# Patient Record
Sex: Male | Born: 1960 | Race: Black or African American | Hispanic: No | Marital: Single
Health system: Southern US, Community
[De-identification: ages and names within clinical notes are randomized; demographics above are authoritative.]

## PROBLEM LIST (undated history)

## (undated) ENCOUNTER — Emergency Department: Discharge status: Absent without leave | Payer: Self-pay

## (undated) ENCOUNTER — Emergency Department: Admission: EM | Payer: Self-pay | Source: Home / Self Care

---

## 2005-02-27 ENCOUNTER — Emergency Department: Payer: Self-pay | Admitting: Emergency Medicine

## 2005-02-28 ENCOUNTER — Emergency Department: Payer: Self-pay | Admitting: Emergency Medicine

## 2016-04-22 ENCOUNTER — Emergency Department: Payer: Medicaid Other

## 2016-04-22 ENCOUNTER — Emergency Department
Admission: EM | Admit: 2016-04-22 | Discharge: 2016-04-22 | Disposition: A | Discharge status: Other Acute Inpatient Hospital | Payer: Medicaid Other | Attending: Emergency Medicine | Admitting: Emergency Medicine

## 2016-04-22 ENCOUNTER — Emergency Department: Admission: EM | Admit: 2016-04-22 | Discharge: 2016-04-22 | Discharge status: Absent without leave | Payer: Self-pay

## 2016-04-22 DIAGNOSIS — I619 Nontraumatic intracerebral hemorrhage, unspecified: Secondary | ICD-10-CM | POA: Insufficient documentation

## 2016-04-22 DIAGNOSIS — Z431 Encounter for attention to gastrostomy: Secondary | ICD-10-CM | POA: Diagnosis not present

## 2016-04-22 DIAGNOSIS — S06319A Contusion and laceration of right cerebrum with loss of consciousness of unspecified duration, initial encounter: Secondary | ICD-10-CM

## 2016-04-22 DIAGNOSIS — S06349A Traumatic hemorrhage of right cerebrum with loss of consciousness of unspecified duration, initial encounter: Secondary | ICD-10-CM

## 2016-04-22 DIAGNOSIS — Z0189 Encounter for other specified special examinations: Secondary | ICD-10-CM

## 2016-04-22 DIAGNOSIS — R4020 Unspecified coma: Secondary | ICD-10-CM | POA: Diagnosis present

## 2016-04-22 LAB — CBC WITH DIFFERENTIAL/PLATELET
BASOS ABS: 0.1 10*3/uL (ref 0–0.1)
Basophils Relative: 0 %
Eosinophils Absolute: 0 10*3/uL (ref 0–0.7)
Eosinophils Relative: 0 %
HEMATOCRIT: 45.7 % (ref 40.0–52.0)
HEMOGLOBIN: 15.6 g/dL (ref 13.0–18.0)
LYMPHS PCT: 3 %
Lymphs Abs: 0.6 10*3/uL — ABNORMAL LOW (ref 1.0–3.6)
MCH: 31.3 pg (ref 26.0–34.0)
MCHC: 34.1 g/dL (ref 32.0–36.0)
MCV: 91.7 fL (ref 80.0–100.0)
Monocytes Absolute: 0.6 10*3/uL (ref 0.2–1.0)
Monocytes Relative: 3 %
NEUTROS ABS: 17.6 10*3/uL — AB (ref 1.4–6.5)
NEUTROS PCT: 94 %
PLATELETS: 298 10*3/uL (ref 150–440)
RBC: 4.99 MIL/uL (ref 4.40–5.90)
RDW: 12.9 % (ref 11.5–14.5)
WBC: 18.9 10*3/uL — AB (ref 3.8–10.6)

## 2016-04-22 LAB — BLOOD GAS, ARTERIAL
Acid-Base Excess: 1.2 mmol/L (ref 0.0–2.0)
Bicarbonate: 26 mmol/L (ref 20.0–28.0)
FIO2: 0.6
MECHVT: 450 mL
Mechanical Rate: 20
O2 SAT: 98.1 %
PATIENT TEMPERATURE: 37
PCO2 ART: 41 mmHg (ref 32.0–48.0)
PEEP: 5 cmH2O
PO2 ART: 105 mmHg (ref 83.0–108.0)
pH, Arterial: 7.41 (ref 7.350–7.450)

## 2016-04-22 LAB — COMPREHENSIVE METABOLIC PANEL
ALK PHOS: 106 U/L (ref 38–126)
ALT: 15 U/L — AB (ref 17–63)
AST: 28 U/L (ref 15–41)
Albumin: 5.1 g/dL — ABNORMAL HIGH (ref 3.5–5.0)
Anion gap: 13 (ref 5–15)
BUN: 23 mg/dL — AB (ref 6–20)
CALCIUM: 9.3 mg/dL (ref 8.9–10.3)
CHLORIDE: 100 mmol/L — AB (ref 101–111)
CO2: 22 mmol/L (ref 22–32)
CREATININE: 1.7 mg/dL — AB (ref 0.61–1.24)
GFR calc Af Amer: 51 mL/min — ABNORMAL LOW (ref >60)
GFR calc non Af Amer: 44 mL/min — ABNORMAL LOW (ref >60)
Glucose, Bld: 192 mg/dL — ABNORMAL HIGH (ref 65–99)
Potassium: 3.6 mmol/L (ref 3.5–5.1)
Sodium: 135 mmol/L (ref 135–145)
Total Bilirubin: 1 mg/dL (ref 0.3–1.2)
Total Protein: 9.5 g/dL — ABNORMAL HIGH (ref 6.5–8.1)

## 2016-04-22 LAB — TROPONIN I: Troponin I: 0.03 ng/mL (ref <0.03)

## 2016-04-22 LAB — TRIGLYCERIDES: TRIGLYCERIDES: 105 mg/dL (ref <150)

## 2016-04-22 MED ORDER — PROPOFOL 1000 MG/100ML IV EMUL
5.0000 ug/kg/min | INTRAVENOUS | Status: DC
  Administered 2016-04-22: 10 ug/kg/min via INTRAVENOUS

## 2016-04-22 MED ORDER — ETOMIDATE 2 MG/ML IV SOLN
20.0000 mg | Freq: Once | INTRAVENOUS | Status: AC
  Administered 2016-04-22: 20 mg via INTRAVENOUS

## 2016-04-22 MED ORDER — SODIUM CHLORIDE 0.9 % IV SOLN
1000.0000 mg | Freq: Once | INTRAVENOUS | Status: AC
  Administered 2016-04-22: 1000 mg via INTRAVENOUS
  Filled 2016-04-22: qty 10

## 2016-04-22 MED ORDER — NICARDIPINE HCL IN NACL 20-0.86 MG/200ML-% IV SOLN
0.0000 mg/h | INTRAVENOUS | Status: DC
  Filled 2016-04-22: qty 200

## 2016-04-22 MED ORDER — NICARDIPINE HCL IN NACL 20-0.86 MG/200ML-% IV SOLN
0.0000 mg/h | INTRAVENOUS | Status: DC
  Administered 2016-04-22: 5 mg/h via INTRAVENOUS
  Filled 2016-04-22: qty 200

## 2016-04-22 MED ORDER — ROCURONIUM BROMIDE 50 MG/5ML IV SOLN
60.0000 mg | Freq: Once | INTRAVENOUS | Status: AC
Start: 2016-04-22 — End: 2016-04-22
  Administered 2016-04-22: 60 mg via INTRAVENOUS

## 2016-05-18 NOTE — ED Notes (Signed)
Sedation/paralytic medications verified with this RN and Sherilyn CooterHenry, RN. Verbal orders received per Pershing ProudSchaevitz, MD for intubation.   Posturing present upon administration of etomidate.

## 2016-05-18 NOTE — ED Notes (Addendum)
Report to Dewayne HatchAnn, RN Neurosurgery ICU room [phone] and Dorene SorrowJerry, RN [facility]. Approx. 0300 arrival per helicopter.

## 2016-05-18 NOTE — ED Notes (Signed)
This RN spoke with Annice PihJackie from transport service at [facility]. She states that [facility] will be consulted on probability of

## 2016-05-18 NOTE — Progress Notes (Signed)
This was a code stemi. Pt. Was unresponsive and he was in a critical condition. Chaplain responded to the code and there was no family member present. An EMS staff told Chaplain that Pt. Family members would most likely not come give to how they spoke with the EMS staff. Chaplain supported the staff team and left after about 30 minutes.

## 2016-05-18 NOTE — ED Notes (Signed)
Report to [facility] Neuro[facility] ICU RN and [facility] at this [facility]. [facility] to room 2741

## 2016-05-18 NOTE — ED Notes (Signed)
[facility] ED: med team to assist this RN: Sherilyn CooterHenry, RN, Gwynneth MunsonButch, RN, Julianne HandlerJulie, EDT, Clinton SawyerKailey, RN, Fleet Contrasachel, RN. Pershing ProudSchaevitz, MD and Danford BadKristie RT at bedside.

## 2016-05-18 NOTE — ED Notes (Addendum)
[facility]. Wife denies medical hx per EMS. LSN 2330. Ems to scene- snorous respirations, 230 palpable BP, incontinent, pupils unequal and unreactive (were right pinpoint in route). Unresponsive to narcan. 183 cbg in route.

## 2016-05-18 NOTE — Progress Notes (Signed)
Transported pt to CT while on the vent and back to ER 5 without incident.

## 2016-05-18 NOTE — ED Notes (Signed)
[facility] d[facility] at this time for [facility].

## 2016-05-18 NOTE — ED Notes (Signed)
Airway: 7.5, 24@lip , (+) color change noted. RT and MD Schaevitz for intubation.

## 2016-05-18 NOTE — ED Provider Notes (Addendum)
[facility] Emergency Department Provider Note  ____________________________________________   First MD Initiated Contact with Patient May 19, 2016 0037     (approximate)  I have reviewed the triage vital signs and the nursing notes.   HISTORY  Chief Complaint No chief complaint on file.   HPI Alexander BushmanBernard Tenaglia is a 56 y.o. male who is brought into the emergency room unresponsive. Per EMS, he was last seen normal about 11:30 PM and was found with snoring respirations and unresponsive. En route, he also had an increasingly large right pupil which became unresponsive and was asymmetric when compared with the left. Patient also with decorticate posturing en route. Per EMS, the patient does not take any medications and has not seen a doctor in quite a long time. Unknown past medical history.  EMS said that the pupils initially were pinpoint and so was given one dose of Narcan without any response.     No past medical history on file.  There are no active problems to display for this patient.   No past surgical history on file.  Prior to Admission medications   Not on File    Allergies Patient has no allergy information on record.  No family history on file.  Social History Social History  Substance Use Topics  . Smoking status: Not on file  . Smokeless tobacco: Not on file  . Alcohol use Not on file    Review of Systems Caveat secondary to altered mental status.  ____________________________________________   PHYSICAL EXAM:  VITAL SIGNS: ED Triage Vitals  Enc Vitals Group     BP May 19, 2016 0027 (!) 195/153     Pulse Rate May 19, 2016 0028 80     Resp May 19, 2016 0030 (!) 34     Temp --      Temp src --      SpO2 May 19, 2016 0028 95 %     Weight May 19, 2016 0039 143 lb 8 oz (65.1 kg)     Height --      Head Circumference --      Peak Flow --      Pain Score --      Pain Loc --      Pain Edu? --      Excl. in GC? --     Constitutional: GCS of 3.    Eyes:  Fixed and dilated right pupil at 6 mm. 3 mm left pupil. Head: Atraumatic. Nose: No congestion/rhinnorhea. Mouth/Throat: Mucous membranes are moist.  Neck: No stridor.   Cardiovascular: tachycardic, regular rhythm. Grossly normal heart sounds.   Respiratory: Tachypnea with snoring respirations but with clear lung fields throughout. Gastrointestinal: Soft. No distention.  Musculoskeletal: No lower extremity edema.  No joint effusions. Neurologic:  GCS of 3.  Pupillary exam as above. Intermittent decorticate retractions which are spontaneous. Skin:  Skin is warm, dry and intact. No rash noted.   ____________________________________________   LABS (all labs ordered are listed, but only abnormal results are displayed)  Labs Reviewed  CBC WITH DIFFERENTIAL/PLATELET - Abnormal; Notable for the following:       Result Value   WBC 18.9 (*)    Neutro Abs 17.6 (*)    Lymphs Abs 0.6 (*)    All other components within normal limits  CBC WITH DIFFERENTIAL/PLATELET  COMPREHENSIVE METABOLIC PANEL  TRIGLYCERIDES  TROPONIN I  BLOOD GAS, ARTERIAL   ____________________________________________  EKG  ED ECG REPORT I, Arelia LongestSchaevitz,  [place]aska Hagger M, the attending physician, personally viewed and interpreted this ECG.  Date: 05-14-16  EKG Time: 0018  Rate: 79  Rhythm: normal sinus rhythm  Axis: Leftward axis  Intervals:none  ST&T Change: Hyperacute T waves in V3 through V5 with minimal ST elevations in V3 and V4. Also with ST elevation in aVR L..    ED ECG REPORT I, Arelia Longest, the attending physician, personally viewed and interpreted this ECG.   Date: 05/14/2016  EKG Time: 0035  Rate: 1119  Rhythm: sinus tachycardia  Axis: normal  Intervals:none  ST&T Change: 1 mm ST elevation in aVR. Diffuse and minimal depressions which may be rate/demand related. T-wave inversions in 1 and aVL.   ____________________________________________  RADIOLOGY  DG Chest 1 View (Final  result)  Result time May 14, 2016 00:59:50  Final result by Janice Coffin, MD (05-14-16 00:59:50)           Narrative:   CLINICAL DATA: Sonorous respirations, altered mental status  EXAM: CHEST 1 VIEW  COMPARISON: None.  FINDINGS: Heart size is normal. Tortuous atherosclerotic appearance of the aorta without aneurysm. Endotracheal tube tip is 3.5 cm above the carina in satisfactory position. The lungs are free of pneumonic consolidation, CHF and effusion. External defibrillator paddles project over the cardiac silhouette. No acute osseous abnormality.  IMPRESSION: 1. Satisfactory endotracheal tube tip positioned 3.5 cm above the carina. 2. No acute pulmonary disease. 3. Aortic atherosclerosis.   Electronically Signed By: Tollie Eth M.D. On: 05-14-2016 00:59           CT Head Wo Contrast (Final result)  Result time May 14, 2016 01:22:23  Final result by Janice Coffin, MD (2016/05/14 01:22:23)           Narrative:   CLINICAL DATA: Altered mental status. Sonorous respirations. Unequal pupils. Hypertensive.  EXAM: CT HEAD WITHOUT CONTRAST  CT CERVICAL SPINE WITHOUT CONTRAST  TECHNIQUE: Multidetector CT imaging of the head and cervical spine was performed following the standard protocol without intravenous contrast. Multiplanar CT image reconstructions of the cervical spine were also generated.  COMPARISON: None.  FINDINGS: CT HEAD FINDINGS  Brain: Acute intraparenchymal hemorrhage involving the right globus pallidus and caudate as well as the basal cisterns, lateral, third and fourth ventricles. There is dilatation the lateral ventricles with right to left midline shift by 7 mm. No extra-axial fluid.  Vascular: No hyperdense vessel or unexpected calcification.  Skull: Normal. Negative for fracture or focal lesion.  Sinuses/Orbits: No acute finding.  Other: None.  CT CERVICAL SPINE FINDINGS  Alignment: The craniocervical relationship is  intact. Mild osteoarthritic joint space narrowing spurring of the atlantodental interval. Normal cervical lordosis.  Skull base and vertebrae: No acute fracture. No primary bone lesion or focal pathologic process.  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 Given its slightly thickened septations, further evaluation with MRI may help to exclude a liposarcoma non emergently.  Disc levels: No focal disc herniation. Osteophytes are noted along the course of cervical spine most prominent from C5 through T1. No jumped facets. No significant canal stenosis or neural foraminal encroachment.  Upper chest: Endotracheal tube is seen within the upper trachea. Tip is not included on this study. Lung apices appear clear.  Other: None  IMPRESSION: 1. Acute intraparenchymal hemorrhage involving obscuring the region of the right caudate, right globus pallidus and anterior limb internal capsule with intraventricular component causing moderate obstructive hydrocephalus and 7 mm of right to left midline shift. Findings may  be secondary to hypertensive bleed. No acute large vascular territory infarction. Critical Value/emergent results were called by telephone at the time of interpretation on 05/20/16 at 1:21 am to Dr. Gladstone Pih , who verbally acknowledged these results. 2. No acute cervical spine fracture or subluxation. Mild degenerate changes of along the vertebral body with spurring. 3. Septated fatty mass overlying the erector spinae muscle measuring 8.2 x 4 1 x 7.8 cm. 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 MRI would be the study of choice for further correlation on a nonemergent basis.   Electronically Signed By: Tollie Eth M.D. On: 20-May-2016 01:22            CT  Cervical Spine Wo Contrast (Final result)  Result time 2016-05-20 01:22:23  Final result by Janice Coffin, MD (05/20/2016 01:22:23)           Narrative:   CLINICAL DATA: Altered mental status. Sonorous respirations. Unequal pupils. Hypertensive.  EXAM: CT HEAD WITHOUT CONTRAST  CT CERVICAL SPINE WITHOUT CONTRAST  TECHNIQUE: Multidetector CT imaging of the head and cervical spine was performed following the standard protocol without intravenous contrast. Multiplanar CT image reconstructions of the cervical spine were also generated.  COMPARISON: None.  FINDINGS: CT HEAD FINDINGS  Brain: Acute intraparenchymal hemorrhage involving the right globus pallidus and caudate as well as the basal cisterns, lateral, third and fourth ventricles. There is dilatation the lateral ventricles with right to left midline shift by 7 mm. No extra-axial fluid.  Vascular: No hyperdense vessel or unexpected calcification.  Skull: Normal. Negative for fracture or focal lesion.  Sinuses/Orbits: No acute finding.  Other: None.  CT CERVICAL SPINE FINDINGS  Alignment: The craniocervical relationship is intact. Mild osteoarthritic joint space narrowing spurring of the atlantodental interval. Normal cervical lordosis.  Skull base and vertebrae: No acute fracture. No primary bone lesion or focal pathologic process.  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 Given its slightly thickened septations, further evaluation with MRI may help to exclude a liposarcoma non emergently.  Disc levels: No focal disc herniation. Osteophytes are noted along the course of cervical spine most prominent from C5 through T1. No jumped facets. No significant canal stenosis or neural foraminal encroachment.  Upper chest: Endotracheal tube is seen within the upper trachea. Tip is not included on this  study. Lung apices appear clear.  Other: None  IMPRESSION: 1. Acute intraparenchymal hemorrhage involving obscuring the region of the right caudate, right globus pallidus and anterior limb internal capsule with intraventricular component causing moderate obstructive hydrocephalus and 7 mm of right to left midline shift. Findings may be secondary to hypertensive bleed. No acute large vascular territory infarction. Critical Value/emergent results were called by telephone at the time of interpretation on 05-20-2016 at 1:21 am to Dr. Gladstone Pih , who verbally acknowledged these results. 2. No acute cervical spine fracture or subluxation. Mild degenerate changes of along the vertebral body with spurring. 3. Septated fatty mass overlying the erector spinae muscle measuring 8.2 x 4 1 x 7.8 cm. 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 MRI would be the study of choice for further correlation on a nonemergent basis.   Electronically Signed By: Tollie Eth M.D. On: 05/20/16 01:22  Intraparenchymal hemorrhage with extension into the ventricles including the fourth ventricle. ____________________________________________   PROCEDURES  Procedure(s) performed:  INTUBATION Performed by: Arelia Longest  Required items: required blood products, implants, devices, and special equipment available Patient identity confirmed: provided demographic data and hospital-assigned identification number Time out: Immediately prior to procedure a "time out" was called to verify the correct patient, procedure, equipment, support staff and site/side marked as required.  Indications: altered mental status with need for airway protection.   Intubation method: Glidescope Laryngoscopy   Preoxygenation: BVM  Sedatives: Etomidate Paralytic: Roccuronium Tube Size: 7.5  cuffed  Post-procedure assessment: chest rise and ETCO2 monitor Breath sounds: equal and absent over the epigastrium Tube secured with: ETT holder Chest x-ray interpreted by radiologist and me.  Chest x-ray findings: endotracheal tube in appropriate position  Patient tolerated the procedure well with no immediate complications.     Procedures  Critical Care performed:  CRITICAL CARE Performed by: Arelia Longest   Total critical care time: 60 minutes  Critical care time was exclusive of separately billable procedures and treating other patients.  Critical care was necessary to treat or prevent imminent or life-threatening deterioration.  Critical care was time spent personally by me on the following activities: development of treatment plan with patient and/or surrogate as well as nursing, discussions with consultants, evaluation of patient's response to treatment, examination of patient, obtaining history from patient or surrogate, ordering and performing treatments and interventions, ordering and review of laboratory studies, ordering and review of radiographic studies, pulse oximetry and re-evaluation of patient's condition.  ____________________________________________   INITIAL IMPRESSION / ASSESSMENT AND PLAN / ED COURSE  Pertinent labs & imaging results that were available during my care of the patient were reviewed by me and considered in my medical decision making (see chart for details).  ----------------------------------------- 1:15 AM on Apr 23, 2016 -----------------------------------------  Because of the EKG the patient was initially made a STEMI alert from the field. However, I think that the EKG changes are likely related to the cerebral pathology. Dr. Kirke Corin called because of a STEMI alert and agrees with my assessment. We will not be calling in the Cath Lab team at this time. At this point have also discussed the case with the [facility] transfer center after  reviewing the CAT scan. I'm awaiting callback from the neurosurgeon at this time. The patient, in addition to being placed on the ventilator, has been placed on a propofol drip as well as a nicardipine drip. He has been ordered Keppra. He does not have any demographic information on the charts to contact family. Nursing also contacted the lobby staff and he does not appear to have family at the hospital at this time. I will update the family as I'm able to. However at this time, I will proceed with the patient's care which requires transfer to a center with neurosurgical service.    ----------------------------------------- 1:37 AM on 2016-04-23 -----------------------------------------  Patient sedated and paralyzed still at this time. Accepted to [facility] for transfer. Titrating up on the patient's nicardipine now as his pressure still over 200, systolic. Accepting doctor is Dr.Jaikumar.  Still no contact with family but in the patient's best interest I will be transferring him expeditiously.  ____________________________________________   FINAL CLINICAL IMPRESSION(S) / ED DIAGNOSES  Final diagnoses:  Intraparenchymal hematoma of brain, right, with loss of consciousness, initial encounter (HCC)      NEW MEDICATIONS STARTED DURING THIS VISIT:  New Prescriptions   No medications on file  Note:  This document was prepared using Dragon voice recognition software and may include unintentional dictation errors.    Myrna Blazer, MD 04-28-2016 0139  Just prior to discharge the patient's blood pressure dropped precipitously to the 70s, systolic. His propofol as well as nicardipine were discontinued. We also started fluids and the pressure was rising through the 80s prior to transport. Heart rate also increased to the 140s.    Myrna Blazer, MD 2016/04/28 [phone]

## 2016-05-18 DEATH — deceased

## 2018-03-26 IMAGING — DX DG CHEST 1V
1 series · 1 of 1 positions shown · non-contrast
Comparison: None.

CLINICAL DATA: Sonorous respirations, altered mental status

EXAM:
CHEST 1 VIEW

[chest ap]
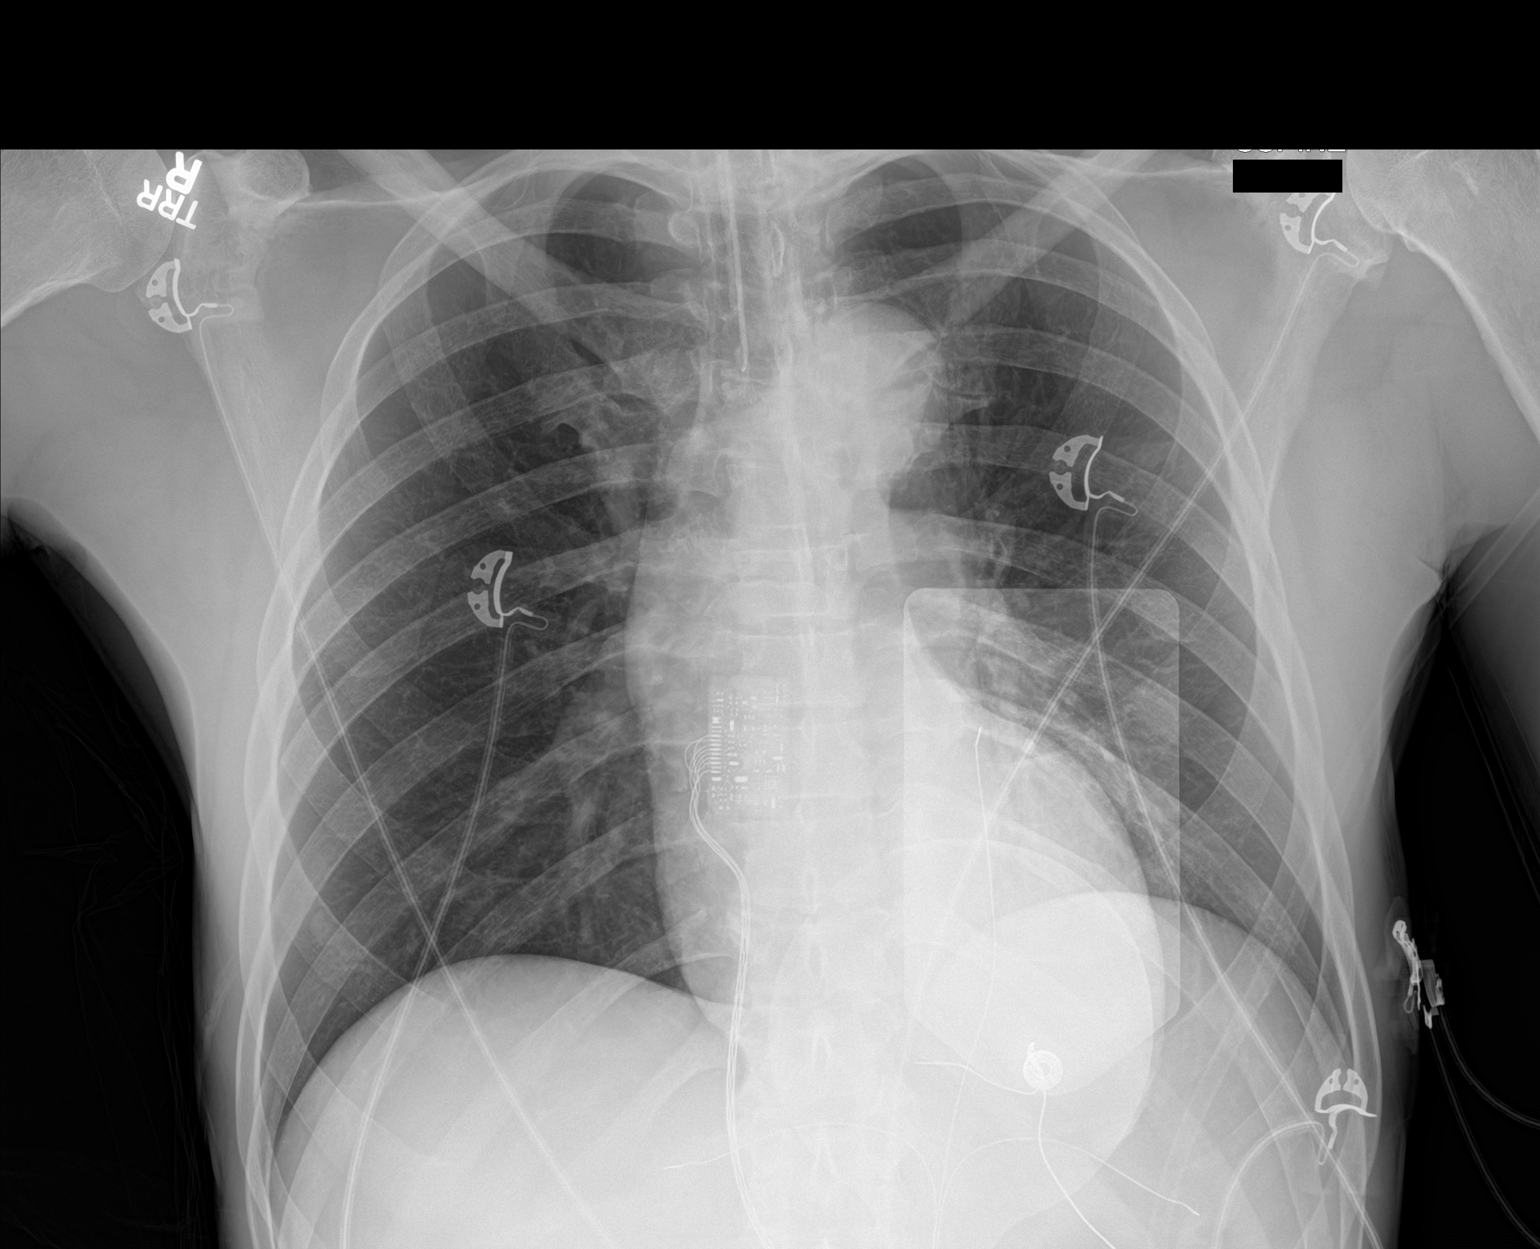

[1 of 1 positions shown; findings below may reference images not displayed]

FINDINGS: Heart size is normal. Tortuous atherosclerotic appearance of the
aorta without aneurysm. Endotracheal tube tip is 3.5 cm above the
carina in satisfactory position. The lungs are free of pneumonic
consolidation, CHF and effusion. External defibrillator paddles
project over the cardiac silhouette. No acute osseous abnormality.
IMPRESSION: 1. Satisfactory endotracheal tube tip positioned 3.5 cm above the
carina.
2. No acute pulmonary disease.
3. Aortic atherosclerosis.

## 2018-03-26 IMAGING — CT CT CERVICAL SPINE W/O CM
3 of 7 series · 12 of 33 positions shown, 13 images · non-contrast
Comparison: None.

CLINICAL DATA: Altered mental status. Sonorous respirations.
Unequal pupils. Hypertensive.

EXAM:
CT HEAD WITHOUT CONTRAST
CT CERVICAL SPINE WITHOUT CONTRAST
TECHNIQUE: Multidetector CT imaging of the head and cervical spine was
performed following the standard protocol without intravenous
contrast. Multiplanar CT image reconstructions of the cervical spine
were also generated.

[Series 4: coronal soft tissue · coronal · 0.31mm/px · 3 of 70 slices shown]
[im 18/70  bone]
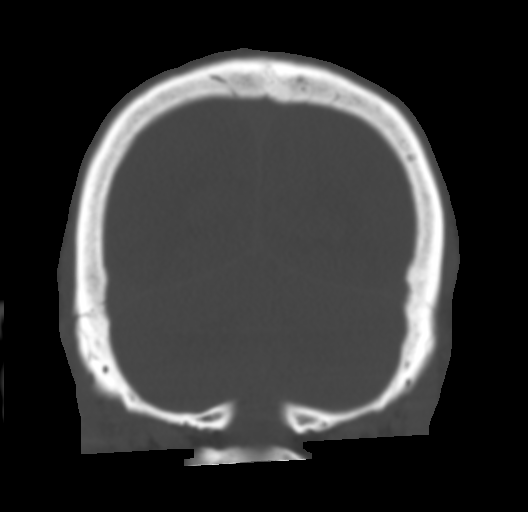
[im 35/70  bone]
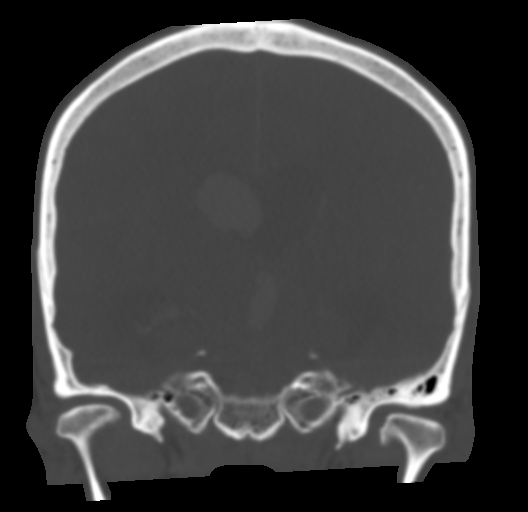
[im 52/70  bone]
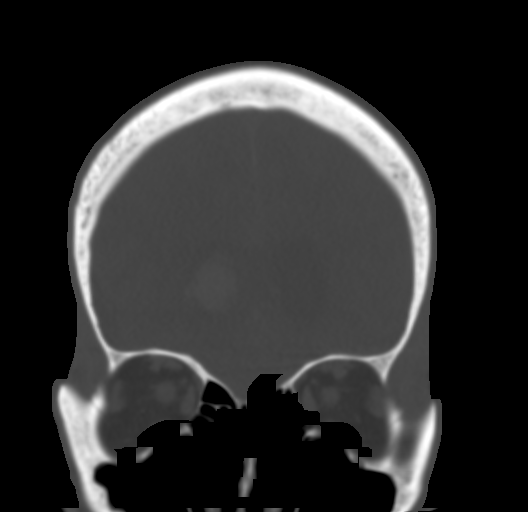

[Series 8: sagittal bone · sagittal · 0.26mm/px · 5 of 61 slices shown]
[im 11/61  bone]
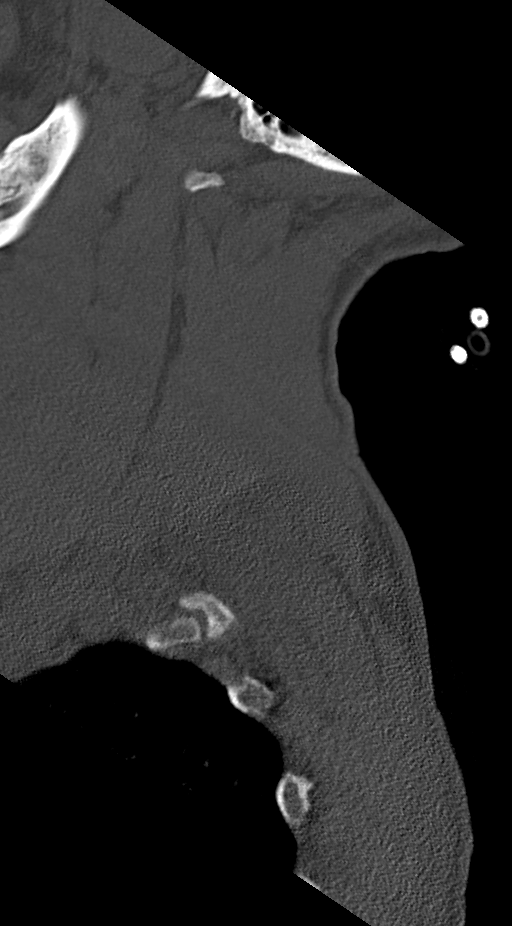
[im 21/61  bone]
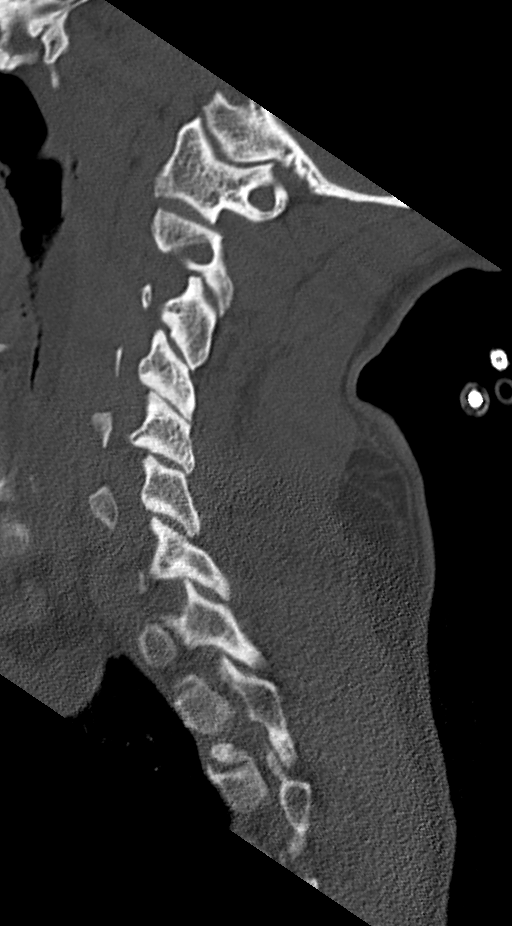
[im 31/61  bone]
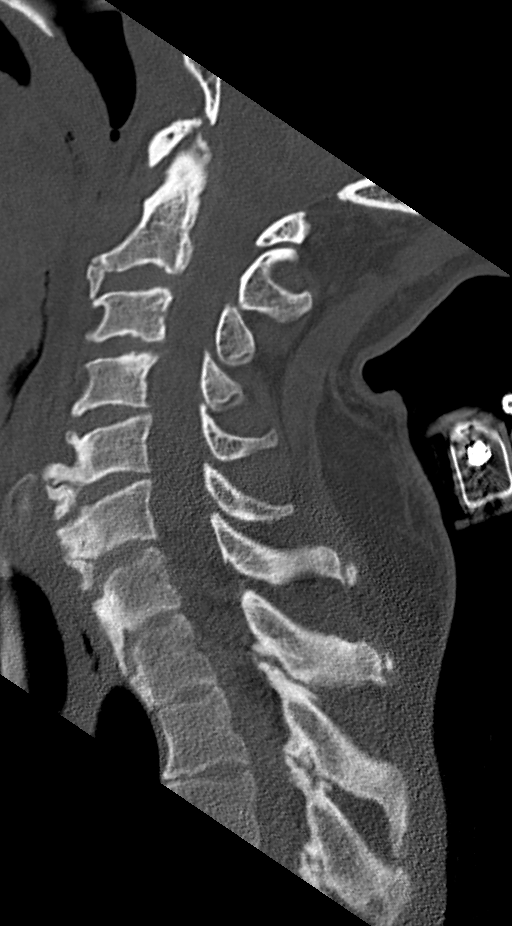
[im 41/61  bone]
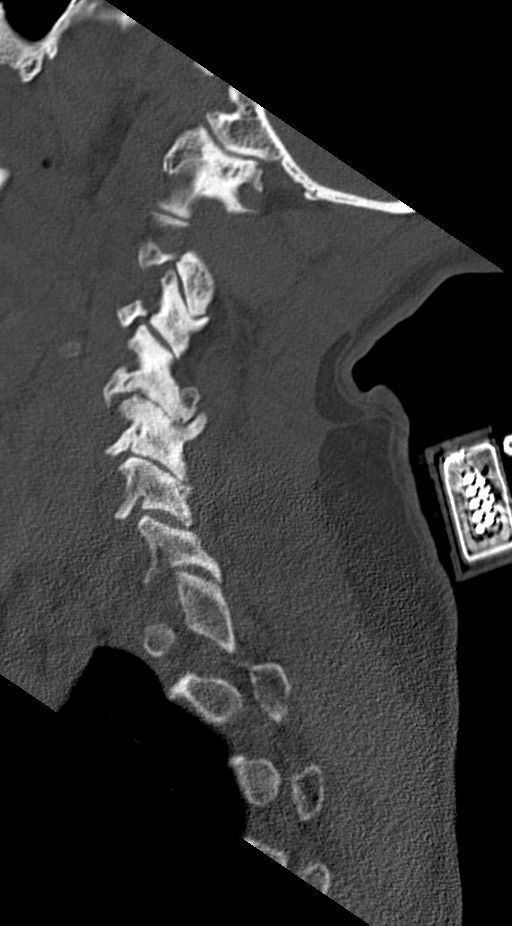
[im 51/61  bone]
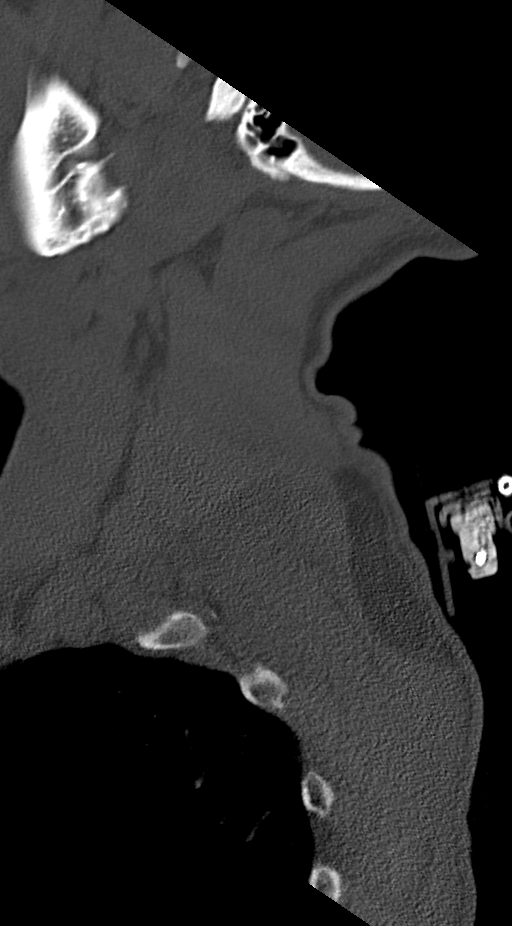

[Series 10: orthogonal bone · axial · 0.23mm/px · z∈[+377,+508]mm · 4 of 119 slices shown, 5 images]
[im 20/119  soft-tissue]
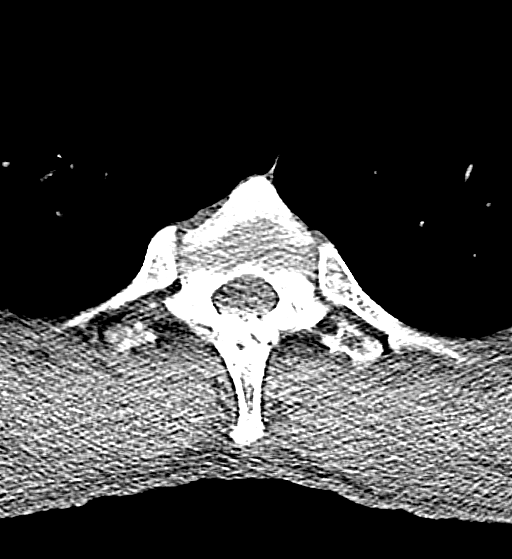
[im 20/119  bone]
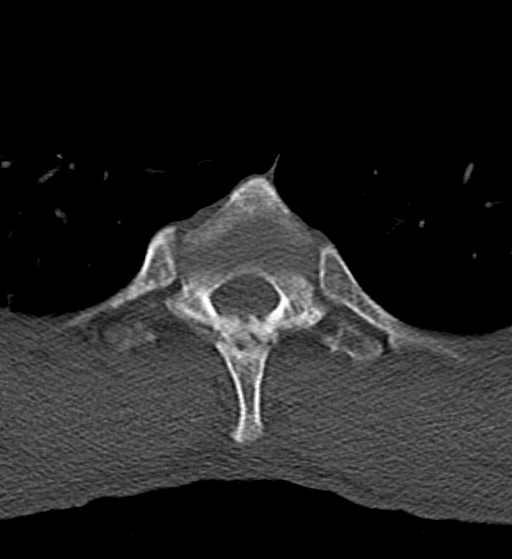
[im 40/119  bone]
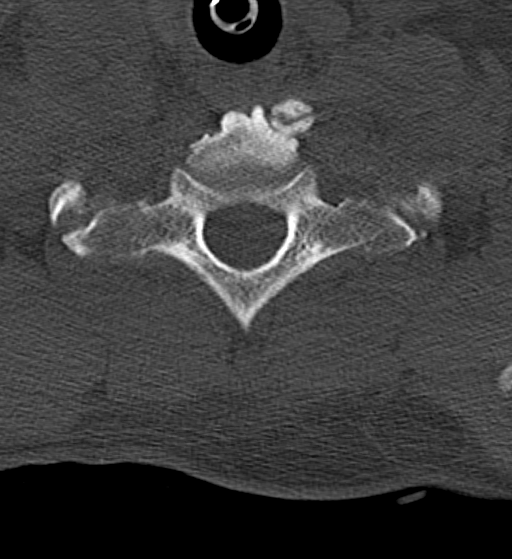
[im 79/119  bone]
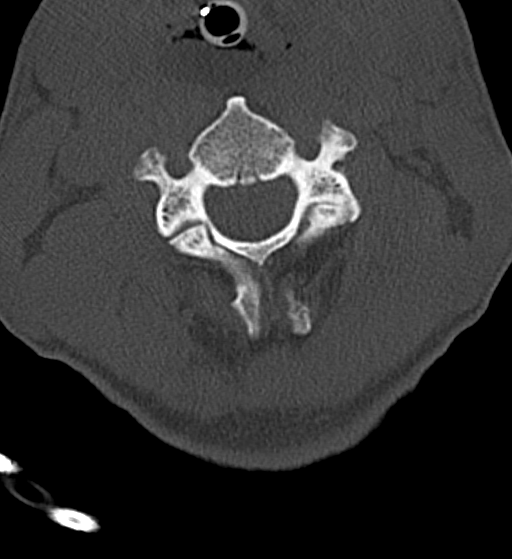
[im 99/119  bone]
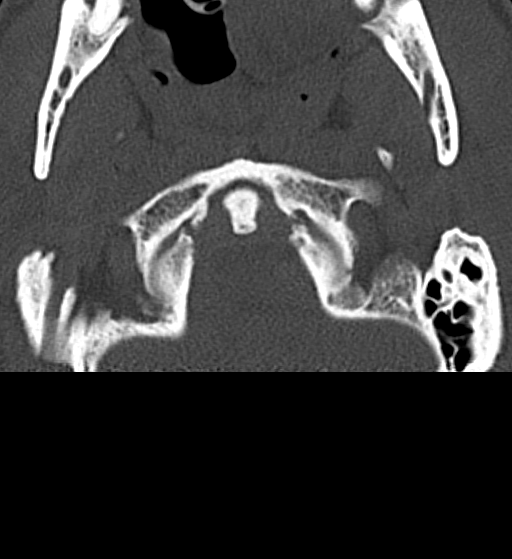

[12 of 33 positions shown; findings below may reference images not displayed]

FINDINGS: CT HEAD FINDINGS

Brain: Acute intraparenchymal hemorrhage involving the right globus
pallidus and caudate as well as the basal cisterns, lateral, third
and fourth ventricles. There is dilatation the lateral ventricles
with right to left midline shift by 7 mm. No extra-axial fluid.

Vascular: No hyperdense vessel or unexpected calcification.

Skull: Normal. Negative for fracture or focal lesion.

Sinuses/Orbits: No acute finding.

Other: None.

CT CERVICAL SPINE FINDINGS

Alignment: The craniocervical relationship is intact. Mild
osteoarthritic joint space narrowing spurring of the atlantodental
interval. Normal cervical lordosis.

Skull base and vertebrae: No acute fracture. No primary bone lesion
or focal pathologic process.

Soft tissues and spinal canal: Septated fatty mass measuring 8.2 x
4.1 x 7.8 cm projecting over the erector spinae muscle consistent
with a large lipoma. Given its slightly thickened septations,
further evaluation with MRI may help to exclude a liposarcoma non
emergently.

Disc levels: No focal disc herniation. Osteophytes are noted along
the course of cervical spine most prominent from C5 through T1. No
jumped facets. No significant canal stenosis or neural foraminal
encroachment.

Upper chest: Endotracheal tube is seen within the upper trachea. Tip
is not included on this study. Lung apices appear clear.

Other: None
IMPRESSION: 1. Acute intraparenchymal hemorrhage involving obscuring the region
of the right caudate, right globus pallidus and anterior limb
internal capsule with intraventricular component causing moderate
obstructive hydrocephalus and 7 mm of right to left midline shift.
Findings may be secondary to hypertensive bleed. No acute large
vascular territory infarction. Critical Value/emergent results were
called by telephone at the time of interpretation on 04/22/2016 at
[DATE] to Dr. LOJAYNA SHERA , who verbally acknowledged these
results.
2. No acute cervical spine fracture or subluxation. Mild degenerate
changes of along the vertebral body with spurring.
3. Septated fatty mass overlying the erector spinae muscle measuring
8.2 x 4 1 x 7.8 cm. Findings are likely related to enlarged lipoma
but given its somewhat thickened septations, liposarcoma cannot be
entirely excluded. MRI would be the study of choice for further
correlation on a nonemergent basis.
